# Patient Record
Sex: Female | Born: 1960 | Race: White | Hispanic: No | Marital: Married
Health system: Southern US, Community
[De-identification: ages and names within clinical notes are randomized; demographics above are authoritative.]

## PROBLEM LIST (undated history)

## (undated) HISTORY — PX: OOPHORECTOMY: SHX86

---

## 2009-02-25 ENCOUNTER — Ambulatory Visit: Payer: Self-pay | Admitting: General Surgery

## 2009-03-04 ENCOUNTER — Ambulatory Visit: Payer: Self-pay | Admitting: General Surgery

## 2009-03-04 HISTORY — PX: BREAST BIOPSY: SHX20

## 2011-05-10 ENCOUNTER — Ambulatory Visit: Payer: Self-pay | Admitting: Family Medicine

## 2012-05-10 ENCOUNTER — Ambulatory Visit: Payer: Self-pay | Admitting: Family Medicine

## 2013-08-22 ENCOUNTER — Ambulatory Visit: Payer: Self-pay | Admitting: Family Medicine

## 2015-03-09 ENCOUNTER — Other Ambulatory Visit: Payer: Self-pay | Admitting: Family Medicine

## 2015-03-09 DIAGNOSIS — Z1239 Encounter for other screening for malignant neoplasm of breast: Secondary | ICD-10-CM

## 2015-03-09 DIAGNOSIS — Z13220 Encounter for screening for lipoid disorders: Secondary | ICD-10-CM

## 2015-03-09 DIAGNOSIS — Z0000 Encounter for general adult medical examination without abnormal findings: Secondary | ICD-10-CM

## 2015-03-13 ENCOUNTER — Other Ambulatory Visit: Payer: BLUE CROSS/BLUE SHIELD

## 2015-03-13 ENCOUNTER — Other Ambulatory Visit: Payer: Self-pay | Admitting: Family Medicine

## 2015-03-13 ENCOUNTER — Other Ambulatory Visit: Payer: Self-pay | Admitting: Unknown Physician Specialty

## 2015-03-13 DIAGNOSIS — Z0000 Encounter for general adult medical examination without abnormal findings: Secondary | ICD-10-CM

## 2015-03-13 DIAGNOSIS — E785 Hyperlipidemia, unspecified: Secondary | ICD-10-CM

## 2015-03-13 DIAGNOSIS — Z13220 Encounter for screening for lipoid disorders: Secondary | ICD-10-CM

## 2015-03-13 LAB — LIPID PANEL PICCOLO, WAIVED
CHOL/HDL RATIO PICCOLO,WAIVE: 2.5 mg/dL
Cholesterol Piccolo, Waived: 218 mg/dL — ABNORMAL HIGH (ref <200)
HDL CHOL PICCOLO, WAIVED: 87 mg/dL (ref >59)
LDL Chol Calc Piccolo Waived: 111 mg/dL — ABNORMAL HIGH (ref <100)
TRIGLYCERIDES PICCOLO,WAIVED: 100 mg/dL (ref <150)
VLDL Chol Calc Piccolo,Waive: 20 mg/dL (ref <30)

## 2015-03-16 LAB — COMPREHENSIVE METABOLIC PANEL
A/G RATIO: 2.4 (ref 1.1–2.5)
ALBUMIN: 4.5 g/dL (ref 3.5–5.5)
ALK PHOS: 45 IU/L (ref 39–117)
ALT: 15 IU/L (ref 0–32)
AST: 17 IU/L (ref 0–40)
BILIRUBIN TOTAL: 0.3 mg/dL (ref 0.0–1.2)
BUN / CREAT RATIO: 13 (ref 9–23)
BUN: 10 mg/dL (ref 6–24)
CHLORIDE: 100 mmol/L (ref 97–108)
CO2: 22 mmol/L (ref 18–29)
CREATININE: 0.79 mg/dL (ref 0.57–1.00)
Calcium: 9.5 mg/dL (ref 8.7–10.2)
GFR calc Af Amer: 97 mL/min/{1.73_m2} (ref >59)
GFR calc non Af Amer: 85 mL/min/{1.73_m2} (ref >59)
GLOBULIN, TOTAL: 1.9 g/dL (ref 1.5–4.5)
Glucose: 81 mg/dL (ref 65–99)
POTASSIUM: 4.3 mmol/L (ref 3.5–5.2)
Sodium: 140 mmol/L (ref 134–144)
Total Protein: 6.4 g/dL (ref 6.0–8.5)

## 2015-06-04 ENCOUNTER — Ambulatory Visit
Admission: RE | Admit: 2015-06-04 | Discharge: 2015-06-04 | Disposition: A | Discharge status: Home / Self Care | Payer: BLUE CROSS/BLUE SHIELD | Source: Ambulatory Visit | Attending: Family Medicine | Admitting: Family Medicine

## 2015-06-04 DIAGNOSIS — Z1231 Encounter for screening mammogram for malignant neoplasm of breast: Secondary | ICD-10-CM | POA: Diagnosis present

## 2015-06-04 DIAGNOSIS — Z1239 Encounter for other screening for malignant neoplasm of breast: Secondary | ICD-10-CM

## 2015-06-08 ENCOUNTER — Encounter: Payer: Self-pay | Admitting: Family Medicine

## 2015-07-23 ENCOUNTER — Telehealth: Payer: Self-pay | Admitting: Physician Assistant

## 2015-07-23 ENCOUNTER — Telehealth: Payer: Self-pay | Admitting: Family Medicine

## 2015-07-23 DIAGNOSIS — R6889 Other general symptoms and signs: Secondary | ICD-10-CM

## 2015-07-23 MED ORDER — OSELTAMIVIR PHOSPHATE 75 MG PO CAPS: 75.0000 mg | ORAL_CAPSULE | Freq: Two times a day (BID) | ORAL | Status: AC

## 2015-07-23 NOTE — Progress Notes (Signed)

## 2015-07-28 NOTE — Telephone Encounter (Signed)
erroneous

## 2015-08-07 ENCOUNTER — Telehealth: Payer: Self-pay | Admitting: Family Medicine

## 2015-08-07 MED ORDER — NEOMYCIN-POLYMYXIN-HC 3.5-10000-1 OT SOLN: 4.0000 [drp] | Freq: Four times a day (QID) | OTIC | Status: AC

## 2015-08-07 MED ORDER — NEOMYCIN-POLYMYXIN-HC 3.5-10000-1 OT SOLN: 4.0000 [drp] | Freq: Four times a day (QID) | OTIC | Status: DC

## 2015-08-07 NOTE — Telephone Encounter (Signed)
Has outer ear infection. Needs some medicine. Will call in cortisporin.

## 2016-07-29 ENCOUNTER — Telehealth: Payer: Self-pay | Admitting: Family Medicine

## 2016-07-29 MED ORDER — ERYTHROMYCIN 5 MG/GM OP OINT: 1.0000 "application " | TOPICAL_OINTMENT | Freq: Every day | OPHTHALMIC | 0 refills | Status: AC

## 2016-07-29 NOTE — Telephone Encounter (Signed)
Has conjunctivitis. Needs erythromycin- Rx sent to her pharmacy

## 2016-09-23 ENCOUNTER — Other Ambulatory Visit: Payer: Self-pay | Admitting: Family Medicine

## 2016-09-23 DIAGNOSIS — Z1231 Encounter for screening mammogram for malignant neoplasm of breast: Secondary | ICD-10-CM

## 2016-09-29 ENCOUNTER — Ambulatory Visit: Admission: RE | Admit: 2016-09-29 | Payer: BLUE CROSS/BLUE SHIELD | Source: Ambulatory Visit

## 2017-02-17 ENCOUNTER — Other Ambulatory Visit: Payer: Self-pay | Admitting: Family Medicine

## 2017-02-17 ENCOUNTER — Other Ambulatory Visit: Payer: Self-pay

## 2017-02-17 DIAGNOSIS — Z111 Encounter for screening for respiratory tuberculosis: Secondary | ICD-10-CM

## 2017-02-22 LAB — QUANTIFERON TB GOLD ASSAY (BLOOD)

## 2017-02-22 LAB — QUANTIFERON IN TUBE
QFT TB AG MINUS NIL VALUE: 0 [IU]/mL
QUANTIFERON MITOGEN VALUE: >10 IU/mL
QUANTIFERON TB AG VALUE: 0.03 IU/mL
QUANTIFERON TB GOLD: NEGATIVE
Quantiferon Nil Value: 0.03 IU/mL

## 2017-06-21 ENCOUNTER — Other Ambulatory Visit: Payer: Self-pay | Admitting: Family Medicine

## 2017-06-21 MED ORDER — AMOXICILLIN 875 MG PO TABS: 875.0000 mg | ORAL_TABLET | Freq: Two times a day (BID) | ORAL | 0 refills | Status: AC

## 2020-10-28 ENCOUNTER — Other Ambulatory Visit (HOSPITAL_COMMUNITY): Payer: Self-pay | Admitting: *Deleted

## 2020-11-04 ENCOUNTER — Ambulatory Visit
Admission: RE | Admit: 2020-11-04 | Discharge: 2020-11-04 | Disposition: A | Discharge status: Home / Self Care | Payer: Self-pay | Source: Ambulatory Visit | Attending: Cardiology | Admitting: Cardiology

## 2020-11-04 ENCOUNTER — Other Ambulatory Visit: Payer: Self-pay
